# Patient Record
Sex: Male | Born: 2000 | Hispanic: No | Marital: Single | Smoking: Never smoker
Health system: Southern US, Community
[De-identification: ages and names within clinical notes are randomized; demographics above are authoritative.]

---
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
## 2020-08-10 ENCOUNTER — Other Ambulatory Visit: Payer: Self-pay

## 2020-08-10 ENCOUNTER — Encounter: Payer: Self-pay | Admitting: Emergency Medicine

## 2020-08-10 ENCOUNTER — Emergency Department
Admission: EM | Admit: 2020-08-10 | Discharge: 2020-08-10 | Disposition: A | Discharge status: Home / Self Care | Payer: Worker's Compensation | Attending: Emergency Medicine | Admitting: Emergency Medicine

## 2020-08-10 ENCOUNTER — Emergency Department: Payer: Worker's Compensation

## 2020-08-10 DIAGNOSIS — Y990 Civilian activity done for income or pay: Secondary | ICD-10-CM | POA: Insufficient documentation

## 2020-08-10 DIAGNOSIS — S6991XA Unspecified injury of right wrist, hand and finger(s), initial encounter: Secondary | ICD-10-CM | POA: Diagnosis present

## 2020-08-10 DIAGNOSIS — X500XXA Overexertion from strenuous movement or load, initial encounter: Secondary | ICD-10-CM | POA: Insufficient documentation

## 2020-08-10 DIAGNOSIS — S62324A Displaced fracture of shaft of fourth metacarpal bone, right hand, initial encounter for closed fracture: Secondary | ICD-10-CM | POA: Insufficient documentation

## 2020-08-10 NOTE — ED Provider Notes (Signed)
[facility] Emergency Department Provider Note  ____________________________________________   Event Date/Time   First MD Initiated Contact with Patient 08/10/20 1127     (approximate)  I have reviewed the triage vital signs and the nursing notes.   HISTORY  Chief Complaint Hand Injury (Workers comp)    HPI Dillon Oconnor is a 20 y.o. male presents emergency department complaint of right hand pain.  Patient was using a drill while at work when the drill slipped and twisted his hand.  Patient states he felt a pop.  Now has large amount of swelling.  Injury happened prior to arrival.  No other injury reported    History reviewed. No pertinent past medical history.  There are no problems to display for this patient.   History reviewed. No pertinent surgical history.  Prior to Admission medications   Not on File    Allergies Patient has no known allergies.  No family history on file.  Social History Social History   Tobacco Use  . Smoking status: Never Smoker  . Smokeless tobacco: Never Used  Substance Use Topics  . Alcohol use: Yes  . Drug use: Not Currently    Review of Systems  Constitutional: No fever/chills Eyes: No visual changes. ENT: No sore throat. Respiratory: Denies cough Genitourinary: Negative for dysuria. Musculoskeletal: Negative for back pain.  Positive for right hand pain Skin: Negative for rash. Psychiatric: no mood changes,     ____________________________________________   PHYSICAL EXAM:  VITAL SIGNS: ED Triage Vitals  Enc Vitals Group     BP 08/10/20 1051 (!) 149/77     Pulse Rate 08/10/20 1051 98     Resp 08/10/20 1051 16     Temp 08/10/20 1055 99 F (37.2 C)     Temp src --      SpO2 08/10/20 1051 98 %     Weight 08/10/20 1052 170 lb (77.1 kg)     Height 08/10/20 1052 5\' 9"  (1.753 m)     Head Circumference --      Peak Flow --      Pain Score 08/10/20 1052 8     Pain Loc --       Pain Edu? --      Excl. in GC? --     Constitutional: Alert and oriented. Well appearing and in no acute distress. Eyes: Conjunctivae are normal.  Head: Atraumatic. Nose: No congestion/rhinnorhea. Mouth/Throat: Mucous membranes are moist.  Neck:  supple no lymphadenopathy noted Cardiovascular: Normal rate, regular rhythm.  Respiratory: Normal respiratory effort.  No retractions GU: deferred Musculoskeletal: FROM all extremities, warm and well perfused, right hand is tender and swollen, tender across the fourth metacarpal, neurovascular is intact Neurologic:  Normal speech and language.  Skin:  Skin is warm, dry and intact. No rash noted. Psychiatric: Mood and affect are normal. Speech and behavior are normal.  ____________________________________________   LABS (all labs ordered are listed, but only abnormal results are displayed)  Labs Reviewed - No data to display ____________________________________________   ____________________________________________  RADIOLOGY  X-ray of right hand  ____________________________________________   PROCEDURES  Procedure(s) performed:   .Ortho Injury Treatment  Date/Time: 08/10/2020 11:42 AM Performed by: 08/12/2020, PA-C Authorized by: Faythe Ghee, PA-C   Consent:    Consent obtained:  Verbal   Consent given by:  Patient   Risks discussed:  FractureInjury location: hand Location details: right hand Injury type: fracture Fracture type: fourth metacarpal Pre-procedure neurovascular assessment: neurovascularly  intact Pre-procedure distal perfusion: normal Pre-procedure neurological function: normal Pre-procedure range of motion: normal  Anesthesia: Local anesthesia used: no  Patient sedated: NoManipulation performed: no Immobilization: splint Splint type: ulnar gutter Splint Applied by: ED Tech Supplies used: Ortho-Glass Post-procedure neurovascular assessment: post-procedure neurovascularly  intact Post-procedure distal perfusion: normal Post-procedure neurological function: normal Post-procedure range of motion: normal       ____________________________________________   INITIAL IMPRESSION / ASSESSMENT AND PLAN / ED COURSE  Pertinent labs & imaging results that were available during my care of the patient were reviewed by me and considered in my medical decision making (see chart for details).   Patient is 20 year old male presents with right hand injury at work.  See HPI.  Physical exam shows right hand to be tender and swollen around fourth metacarpal.  X-ray which was reviewed by me and confirmed by radiology shows a right fourth metacarpal fracture along the shaft.  Slightly displaced.  I did discuss findings with patient.  He was placed in a ulnar gutter OCL.  He states he is not any pain medication.  He was discharged with instructions for no use of the right hand while at work.  He was discharged in stable condition     Dillon Oconnor was evaluated in Emergency Department on 08/10/2020 for the symptoms described in the history of present illness. He was evaluated in the context of the global COVID-19 pandemic, which necessitated consideration that the patient might be at risk for infection with the SARS-CoV-2 virus that causes COVID-19. Institutional protocols and algorithms that pertain to the evaluation of patients at risk for COVID-19 are in a state of rapid change based on information released by regulatory bodies including the CDC and federal and state organizations. These policies and algorithms were followed during the patient's care in the ED.    As part of my medical decision making, I reviewed the following data within the electronic MEDICAL RECORD NUMBER Nursing notes reviewed and incorporated, Old chart reviewed, Radiograph reviewed , Notes from prior ED visits and [place] Controlled Substance Database  ____________________________________________   FINAL  CLINICAL IMPRESSION(S) / ED DIAGNOSES  Final diagnoses:  Displaced fracture of shaft of fourth metacarpal bone, right hand, initial encounter for closed fracture      NEW MEDICATIONS STARTED DURING THIS VISIT:  New Prescriptions   No medications on file     Note:  This document was prepared using Dragon voice recognition software and may include unintentional dictation errors.    Faythe Ghee, PA-C 08/10/20 1144    Minna Antis, MD 08/10/20 1328

## 2020-08-10 NOTE — ED Triage Notes (Signed)
Pt to ED via POV, pt states that he was at work and injured his right hand. Pt has swelling noted to the hand. Pt states that he works for [employer]. Pt is in NAD.

## 2020-08-10 NOTE — ED Notes (Signed)
Ice pack given for injury

## 2020-08-10 NOTE — Discharge Instructions (Signed)
Follow-up with orthopedics.  Please call for an appointment.  If your Worker's Comp. insurance company does not use [facility], they should make an appointment for you with a hand specialist as soon as possible.  Keep the splint on until evaluated by orthopedics.  Elevate and ice.  Take Tylenol or ibuprofen for pain as needed.  No use of the right hand while at work until evaluated by orthopedics

## 2022-05-28 ENCOUNTER — Encounter: Payer: Self-pay | Admitting: Emergency Medicine

## 2022-05-28 ENCOUNTER — Emergency Department: Payer: Self-pay

## 2022-05-28 ENCOUNTER — Emergency Department
Admission: EM | Admit: 2022-05-28 | Discharge: 2022-05-28 | Disposition: A | Discharge status: Home / Self Care | Payer: Self-pay | Attending: Emergency Medicine | Admitting: Emergency Medicine

## 2022-05-28 ENCOUNTER — Other Ambulatory Visit: Payer: Self-pay

## 2022-05-28 DIAGNOSIS — Y92410 Unspecified street and highway as the place of occurrence of the external cause: Secondary | ICD-10-CM | POA: Diagnosis not present

## 2022-05-28 DIAGNOSIS — M25562 Pain in left knee: Secondary | ICD-10-CM | POA: Insufficient documentation

## 2022-05-28 DIAGNOSIS — M545 Low back pain, unspecified: Secondary | ICD-10-CM | POA: Diagnosis not present

## 2022-05-28 NOTE — Discharge Instructions (Signed)
Your x-ray was normal.  You may continue to take Tylenol/ibuprofen per package instructions as needed for your pain.  Please return for any new, worsening, or change in symptoms or other concerns.
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
## 2022-09-11 IMAGING — CR DG HAND COMPLETE 3+V*R*
3 series · 3 of 3 positions shown · non-contrast
Comparison: None.

CLINICAL DATA: Work injury.

EXAM:
RIGHT HAND - COMPLETE 3+ VIEW

[hand ap]
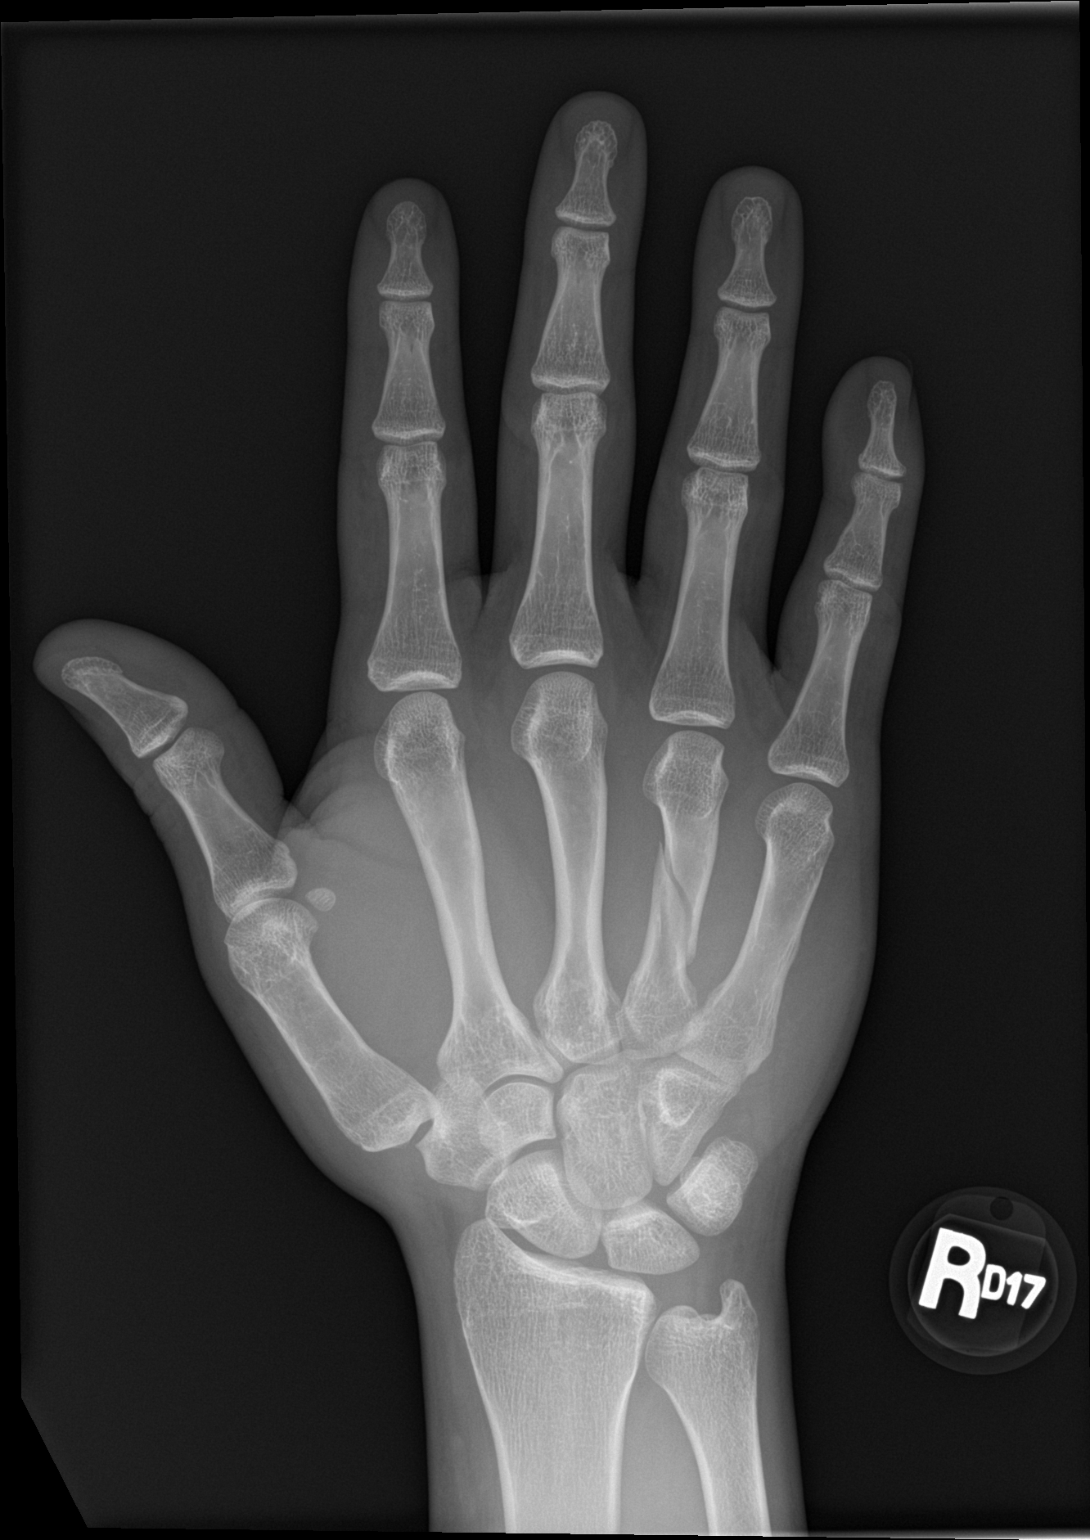

[hand obl]
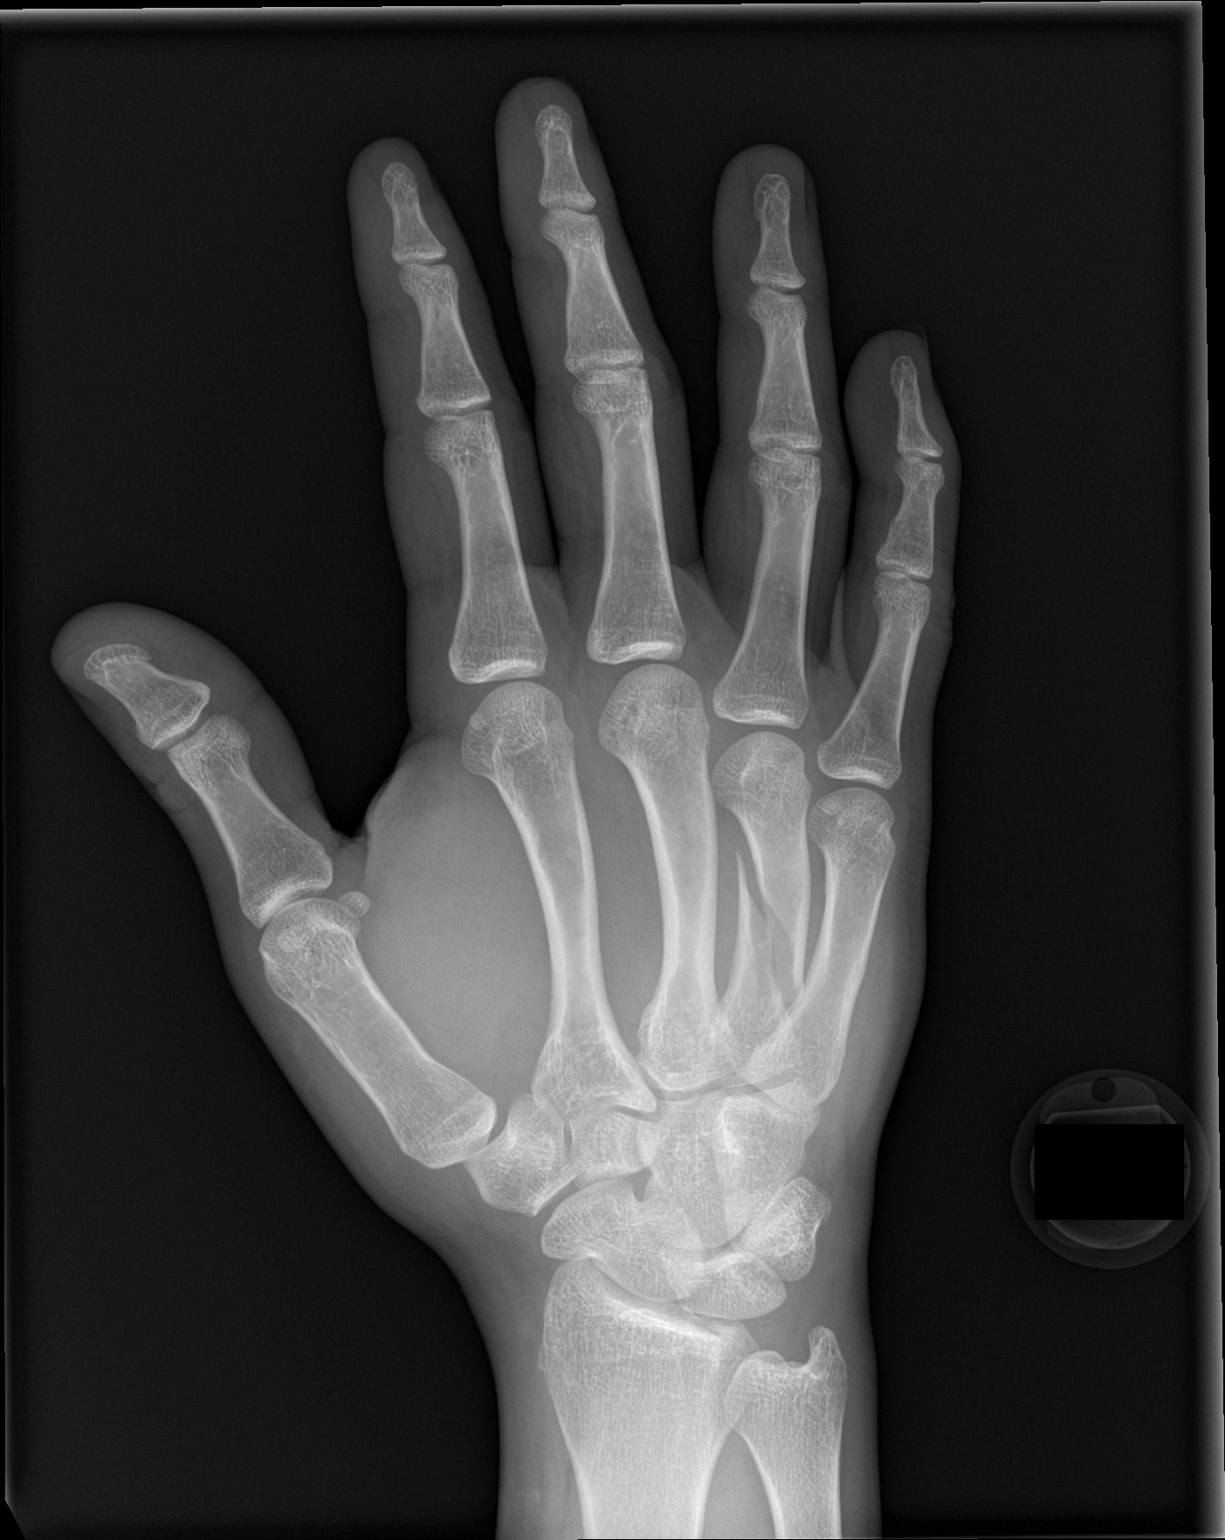

[hand lat]
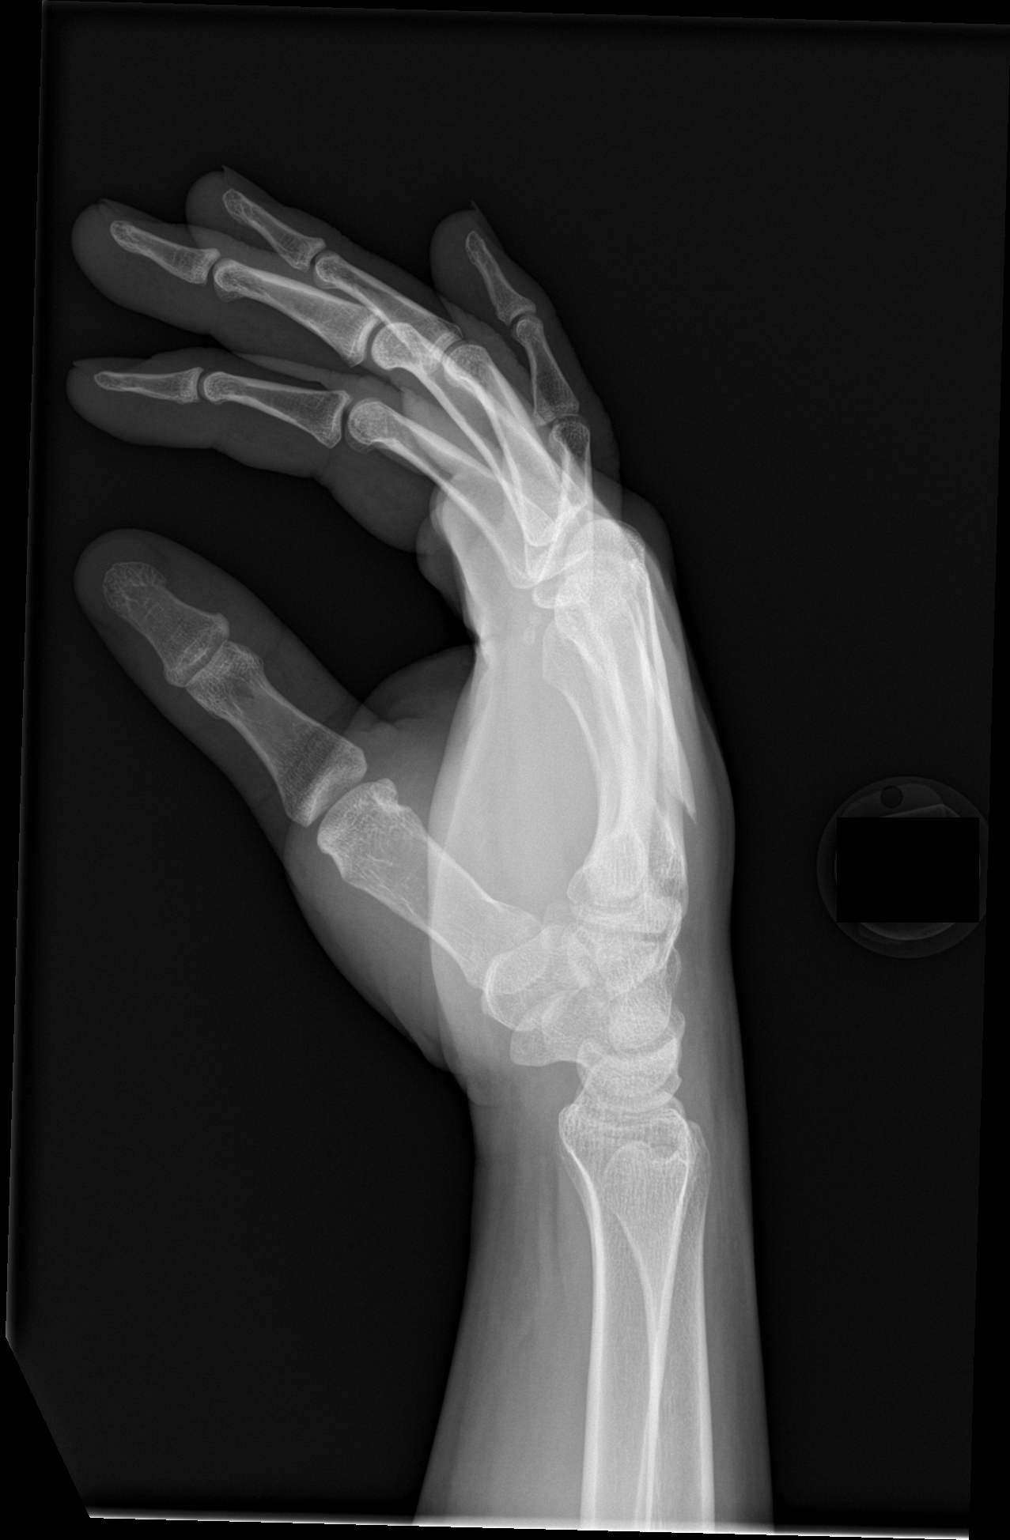

[3 of 3 positions shown; findings below may reference images not displayed]

FINDINGS: Oblique fracture through the midshaft fourth metacarpal with mild
displacement. No dislocation.
IMPRESSION: Midshaft oblique fracture of the fourth metacarpal.
